# Patient Record
Sex: Male | Born: 1940 | Race: White | Hispanic: No | Marital: Married | Smoking: Never smoker
Health system: Southern US, Community
[De-identification: ages and names within clinical notes are randomized; demographics above are authoritative.]

## PROBLEM LIST (undated history)

## (undated) DIAGNOSIS — N289 Disorder of kidney and ureter, unspecified: Secondary | ICD-10-CM

---

## 2014-05-04 DIAGNOSIS — M5440 Lumbago with sciatica, unspecified side: Secondary | ICD-10-CM | POA: Diagnosis not present

## 2014-05-04 DIAGNOSIS — Z6828 Body mass index (BMI) 28.0-28.9, adult: Secondary | ICD-10-CM | POA: Diagnosis not present

## 2014-08-15 DIAGNOSIS — M5440 Lumbago with sciatica, unspecified side: Secondary | ICD-10-CM | POA: Diagnosis not present

## 2014-08-15 DIAGNOSIS — Z6828 Body mass index (BMI) 28.0-28.9, adult: Secondary | ICD-10-CM | POA: Diagnosis not present

## 2014-09-14 DIAGNOSIS — Z6828 Body mass index (BMI) 28.0-28.9, adult: Secondary | ICD-10-CM | POA: Diagnosis not present

## 2014-09-14 DIAGNOSIS — L723 Sebaceous cyst: Secondary | ICD-10-CM | POA: Diagnosis not present

## 2014-09-14 DIAGNOSIS — Z9181 History of falling: Secondary | ICD-10-CM | POA: Diagnosis not present

## 2015-05-31 DIAGNOSIS — Z205 Contact with and (suspected) exposure to viral hepatitis: Secondary | ICD-10-CM | POA: Diagnosis not present

## 2015-05-31 DIAGNOSIS — Z6828 Body mass index (BMI) 28.0-28.9, adult: Secondary | ICD-10-CM | POA: Diagnosis not present

## 2015-05-31 DIAGNOSIS — M5440 Lumbago with sciatica, unspecified side: Secondary | ICD-10-CM | POA: Diagnosis not present

## 2015-06-16 DIAGNOSIS — M5136 Other intervertebral disc degeneration, lumbar region: Secondary | ICD-10-CM | POA: Diagnosis not present

## 2015-06-26 DIAGNOSIS — M47816 Spondylosis without myelopathy or radiculopathy, lumbar region: Secondary | ICD-10-CM | POA: Diagnosis not present

## 2015-06-26 DIAGNOSIS — M5136 Other intervertebral disc degeneration, lumbar region: Secondary | ICD-10-CM | POA: Diagnosis not present

## 2015-06-28 DIAGNOSIS — M5136 Other intervertebral disc degeneration, lumbar region: Secondary | ICD-10-CM | POA: Diagnosis not present

## 2015-07-14 DIAGNOSIS — M5416 Radiculopathy, lumbar region: Secondary | ICD-10-CM | POA: Diagnosis not present

## 2015-07-14 DIAGNOSIS — M5136 Other intervertebral disc degeneration, lumbar region: Secondary | ICD-10-CM | POA: Diagnosis not present

## 2015-08-22 DIAGNOSIS — M65311 Trigger thumb, right thumb: Secondary | ICD-10-CM | POA: Diagnosis not present

## 2015-08-28 DIAGNOSIS — M65311 Trigger thumb, right thumb: Secondary | ICD-10-CM | POA: Diagnosis not present

## 2015-12-01 DIAGNOSIS — M5136 Other intervertebral disc degeneration, lumbar region: Secondary | ICD-10-CM | POA: Diagnosis not present

## 2015-12-06 DIAGNOSIS — J00 Acute nasopharyngitis [common cold]: Secondary | ICD-10-CM | POA: Diagnosis not present

## 2015-12-06 DIAGNOSIS — Z23 Encounter for immunization: Secondary | ICD-10-CM | POA: Diagnosis not present

## 2015-12-15 DIAGNOSIS — M5136 Other intervertebral disc degeneration, lumbar region: Secondary | ICD-10-CM | POA: Diagnosis not present

## 2015-12-15 DIAGNOSIS — M549 Dorsalgia, unspecified: Secondary | ICD-10-CM | POA: Diagnosis not present

## 2016-04-19 DIAGNOSIS — H35372 Puckering of macula, left eye: Secondary | ICD-10-CM | POA: Diagnosis not present

## 2016-04-19 DIAGNOSIS — H35353 Cystoid macular degeneration, bilateral: Secondary | ICD-10-CM | POA: Diagnosis not present

## 2016-04-19 DIAGNOSIS — H43812 Vitreous degeneration, left eye: Secondary | ICD-10-CM | POA: Diagnosis not present

## 2016-04-24 DIAGNOSIS — E663 Overweight: Secondary | ICD-10-CM | POA: Diagnosis not present

## 2016-04-24 DIAGNOSIS — Z6829 Body mass index (BMI) 29.0-29.9, adult: Secondary | ICD-10-CM | POA: Diagnosis not present

## 2016-04-24 DIAGNOSIS — M545 Low back pain: Secondary | ICD-10-CM | POA: Diagnosis not present

## 2016-04-24 DIAGNOSIS — Z1389 Encounter for screening for other disorder: Secondary | ICD-10-CM | POA: Diagnosis not present

## 2016-05-21 DIAGNOSIS — M5136 Other intervertebral disc degeneration, lumbar region: Secondary | ICD-10-CM | POA: Diagnosis not present

## 2016-05-21 DIAGNOSIS — M5410 Radiculopathy, site unspecified: Secondary | ICD-10-CM | POA: Diagnosis not present

## 2016-05-31 DIAGNOSIS — M5416 Radiculopathy, lumbar region: Secondary | ICD-10-CM | POA: Diagnosis not present

## 2016-05-31 DIAGNOSIS — M5136 Other intervertebral disc degeneration, lumbar region: Secondary | ICD-10-CM | POA: Diagnosis not present

## 2016-06-24 DIAGNOSIS — R109 Unspecified abdominal pain: Secondary | ICD-10-CM | POA: Diagnosis not present

## 2016-06-24 DIAGNOSIS — J09X2 Influenza due to identified novel influenza A virus with other respiratory manifestations: Secondary | ICD-10-CM | POA: Diagnosis not present

## 2016-06-24 DIAGNOSIS — R072 Precordial pain: Secondary | ICD-10-CM | POA: Diagnosis not present

## 2016-06-24 DIAGNOSIS — R05 Cough: Secondary | ICD-10-CM | POA: Diagnosis not present

## 2016-06-24 DIAGNOSIS — J189 Pneumonia, unspecified organism: Secondary | ICD-10-CM | POA: Diagnosis not present

## 2016-06-24 DIAGNOSIS — R0789 Other chest pain: Secondary | ICD-10-CM | POA: Diagnosis not present

## 2016-06-24 DIAGNOSIS — R079 Chest pain, unspecified: Secondary | ICD-10-CM | POA: Diagnosis not present

## 2016-06-24 DIAGNOSIS — R110 Nausea: Secondary | ICD-10-CM | POA: Diagnosis not present

## 2016-06-24 DIAGNOSIS — R0602 Shortness of breath: Secondary | ICD-10-CM | POA: Diagnosis not present

## 2016-06-24 DIAGNOSIS — R51 Headache: Secondary | ICD-10-CM | POA: Diagnosis not present

## 2016-06-24 DIAGNOSIS — I672 Cerebral atherosclerosis: Secondary | ICD-10-CM | POA: Diagnosis not present

## 2016-06-24 DIAGNOSIS — R1084 Generalized abdominal pain: Secondary | ICD-10-CM | POA: Diagnosis not present

## 2016-06-25 DIAGNOSIS — J189 Pneumonia, unspecified organism: Secondary | ICD-10-CM | POA: Diagnosis not present

## 2016-06-25 DIAGNOSIS — R110 Nausea: Secondary | ICD-10-CM | POA: Diagnosis not present

## 2016-06-25 DIAGNOSIS — R072 Precordial pain: Secondary | ICD-10-CM | POA: Diagnosis not present

## 2016-06-25 DIAGNOSIS — J101 Influenza due to other identified influenza virus with other respiratory manifestations: Secondary | ICD-10-CM | POA: Diagnosis not present

## 2016-06-26 DIAGNOSIS — J101 Influenza due to other identified influenza virus with other respiratory manifestations: Secondary | ICD-10-CM | POA: Diagnosis not present

## 2016-06-26 DIAGNOSIS — R110 Nausea: Secondary | ICD-10-CM | POA: Diagnosis not present

## 2016-06-26 DIAGNOSIS — R072 Precordial pain: Secondary | ICD-10-CM | POA: Diagnosis not present

## 2016-06-26 DIAGNOSIS — J189 Pneumonia, unspecified organism: Secondary | ICD-10-CM | POA: Diagnosis not present

## 2016-06-26 DIAGNOSIS — R1110 Vomiting, unspecified: Secondary | ICD-10-CM | POA: Diagnosis not present

## 2016-06-27 DIAGNOSIS — R1110 Vomiting, unspecified: Secondary | ICD-10-CM | POA: Diagnosis not present

## 2016-06-27 DIAGNOSIS — R110 Nausea: Secondary | ICD-10-CM | POA: Diagnosis not present

## 2016-06-27 DIAGNOSIS — J189 Pneumonia, unspecified organism: Secondary | ICD-10-CM | POA: Diagnosis not present

## 2016-06-27 DIAGNOSIS — J101 Influenza due to other identified influenza virus with other respiratory manifestations: Secondary | ICD-10-CM | POA: Diagnosis not present

## 2016-06-27 DIAGNOSIS — R072 Precordial pain: Secondary | ICD-10-CM | POA: Diagnosis not present

## 2016-07-02 DIAGNOSIS — J189 Pneumonia, unspecified organism: Secondary | ICD-10-CM | POA: Diagnosis not present

## 2016-07-02 DIAGNOSIS — Z09 Encounter for follow-up examination after completed treatment for conditions other than malignant neoplasm: Secondary | ICD-10-CM | POA: Diagnosis not present

## 2016-07-05 DIAGNOSIS — R079 Chest pain, unspecified: Secondary | ICD-10-CM | POA: Diagnosis not present

## 2016-07-10 DIAGNOSIS — R072 Precordial pain: Secondary | ICD-10-CM | POA: Diagnosis not present

## 2016-07-10 DIAGNOSIS — I459 Conduction disorder, unspecified: Secondary | ICD-10-CM | POA: Diagnosis not present

## 2016-09-09 DIAGNOSIS — J449 Chronic obstructive pulmonary disease, unspecified: Secondary | ICD-10-CM | POA: Diagnosis not present

## 2016-10-01 DIAGNOSIS — R938 Abnormal findings on diagnostic imaging of other specified body structures: Secondary | ICD-10-CM | POA: Diagnosis not present

## 2016-10-01 DIAGNOSIS — Z8659 Personal history of other mental and behavioral disorders: Secondary | ICD-10-CM | POA: Diagnosis not present

## 2016-10-01 DIAGNOSIS — E663 Overweight: Secondary | ICD-10-CM | POA: Diagnosis not present

## 2016-10-01 DIAGNOSIS — M79675 Pain in left toe(s): Secondary | ICD-10-CM | POA: Diagnosis not present

## 2016-10-01 DIAGNOSIS — N200 Calculus of kidney: Secondary | ICD-10-CM | POA: Diagnosis not present

## 2016-10-01 DIAGNOSIS — Z6827 Body mass index (BMI) 27.0-27.9, adult: Secondary | ICD-10-CM | POA: Diagnosis not present

## 2016-10-01 DIAGNOSIS — Z139 Encounter for screening, unspecified: Secondary | ICD-10-CM | POA: Diagnosis not present

## 2016-10-01 DIAGNOSIS — I2510 Atherosclerotic heart disease of native coronary artery without angina pectoris: Secondary | ICD-10-CM | POA: Diagnosis not present

## 2016-10-02 DIAGNOSIS — S99922A Unspecified injury of left foot, initial encounter: Secondary | ICD-10-CM | POA: Diagnosis not present

## 2016-10-02 DIAGNOSIS — M79675 Pain in left toe(s): Secondary | ICD-10-CM | POA: Diagnosis not present

## 2016-12-20 DIAGNOSIS — E663 Overweight: Secondary | ICD-10-CM | POA: Diagnosis not present

## 2016-12-20 DIAGNOSIS — R6883 Chills (without fever): Secondary | ICD-10-CM | POA: Diagnosis not present

## 2016-12-20 DIAGNOSIS — Z23 Encounter for immunization: Secondary | ICD-10-CM | POA: Diagnosis not present

## 2016-12-20 DIAGNOSIS — R350 Frequency of micturition: Secondary | ICD-10-CM | POA: Diagnosis not present

## 2016-12-20 DIAGNOSIS — R3989 Other symptoms and signs involving the genitourinary system: Secondary | ICD-10-CM | POA: Diagnosis not present

## 2016-12-20 DIAGNOSIS — J4521 Mild intermittent asthma with (acute) exacerbation: Secondary | ICD-10-CM | POA: Diagnosis not present

## 2016-12-20 DIAGNOSIS — R509 Fever, unspecified: Secondary | ICD-10-CM | POA: Diagnosis not present

## 2016-12-21 ENCOUNTER — Encounter (HOSPITAL_BASED_OUTPATIENT_CLINIC_OR_DEPARTMENT_OTHER): Payer: Self-pay | Admitting: Emergency Medicine

## 2016-12-21 ENCOUNTER — Emergency Department (HOSPITAL_BASED_OUTPATIENT_CLINIC_OR_DEPARTMENT_OTHER)
Admission: EM | Admit: 2016-12-21 | Discharge: 2016-12-21 | Disposition: A | Discharge status: Home / Self Care | Payer: Medicare HMO | Attending: Emergency Medicine | Admitting: Emergency Medicine

## 2016-12-21 ENCOUNTER — Emergency Department (HOSPITAL_BASED_OUTPATIENT_CLINIC_OR_DEPARTMENT_OTHER): Payer: Medicare HMO

## 2016-12-21 DIAGNOSIS — R109 Unspecified abdominal pain: Secondary | ICD-10-CM | POA: Insufficient documentation

## 2016-12-21 DIAGNOSIS — R69 Illness, unspecified: Secondary | ICD-10-CM | POA: Diagnosis not present

## 2016-12-21 DIAGNOSIS — R531 Weakness: Secondary | ICD-10-CM | POA: Diagnosis not present

## 2016-12-21 DIAGNOSIS — R110 Nausea: Secondary | ICD-10-CM | POA: Insufficient documentation

## 2016-12-21 DIAGNOSIS — R350 Frequency of micturition: Secondary | ICD-10-CM | POA: Insufficient documentation

## 2016-12-21 DIAGNOSIS — J111 Influenza due to unidentified influenza virus with other respiratory manifestations: Secondary | ICD-10-CM

## 2016-12-21 DIAGNOSIS — R509 Fever, unspecified: Secondary | ICD-10-CM | POA: Diagnosis not present

## 2016-12-21 HISTORY — DX: Disorder of kidney and ureter, unspecified: N28.9

## 2016-12-21 LAB — CBC WITH DIFFERENTIAL/PLATELET
BASOS ABS: 0 10*3/uL (ref 0.0–0.1)
BASOS PCT: 0 %
EOS ABS: 0 10*3/uL (ref 0.0–0.7)
Eosinophils Relative: 0 %
HEMATOCRIT: 46.1 % (ref 39.0–52.0)
Hemoglobin: 15.7 g/dL (ref 13.0–17.0)
LYMPHS ABS: 0.5 10*3/uL — AB (ref 0.7–4.0)
LYMPHS PCT: 11 %
MCH: 31.5 pg (ref 26.0–34.0)
MCHC: 34.1 g/dL (ref 30.0–36.0)
MCV: 92.6 fL (ref 78.0–100.0)
Monocytes Absolute: 0.5 10*3/uL (ref 0.1–1.0)
Monocytes Relative: 10 %
NEUTROS ABS: 3.9 10*3/uL (ref 1.7–7.7)
NEUTROS PCT: 79 %
PLATELETS: 131 10*3/uL — AB (ref 150–400)
RBC: 4.98 MIL/uL (ref 4.22–5.81)
RDW: 13.4 % (ref 11.5–15.5)
WBC: 4.9 10*3/uL (ref 4.0–10.5)

## 2016-12-21 LAB — COMPREHENSIVE METABOLIC PANEL
ALBUMIN: 3.7 g/dL (ref 3.5–5.0)
ALK PHOS: 67 U/L (ref 38–126)
ALT: 20 U/L (ref 17–63)
ANION GAP: 8 (ref 5–15)
AST: 24 U/L (ref 15–41)
BUN: 15 mg/dL (ref 6–20)
CALCIUM: 8.3 mg/dL — AB (ref 8.9–10.3)
CO2: 22 mmol/L (ref 22–32)
Chloride: 105 mmol/L (ref 101–111)
Creatinine, Ser: 1.03 mg/dL (ref 0.61–1.24)
GFR calc Af Amer: >60 mL/min (ref >60)
GFR calc non Af Amer: >60 mL/min (ref >60)
GLUCOSE: 98 mg/dL (ref 65–99)
POTASSIUM: 4 mmol/L (ref 3.5–5.1)
SODIUM: 135 mmol/L (ref 135–145)
Total Bilirubin: 0.6 mg/dL (ref 0.3–1.2)
Total Protein: 6.5 g/dL (ref 6.5–8.1)

## 2016-12-21 LAB — URINALYSIS, ROUTINE W REFLEX MICROSCOPIC
GLUCOSE, UA: NEGATIVE mg/dL
Hgb urine dipstick: NEGATIVE
KETONES UR: 15 mg/dL — AB
LEUKOCYTES UA: NEGATIVE
NITRITE: NEGATIVE
PH: 5.5 (ref 5.0–8.0)
PROTEIN: NEGATIVE mg/dL
Specific Gravity, Urine: >1.03 — ABNORMAL HIGH (ref 1.005–1.030)

## 2016-12-21 LAB — LIPASE, BLOOD: Lipase: 28 U/L (ref 11–51)

## 2016-12-21 MED ORDER — SODIUM CHLORIDE 0.9 % IV BOLUS (SEPSIS)
1000.0000 mL | Freq: Once | INTRAVENOUS | Status: AC
  Administered 2016-12-21: 1000 mL via INTRAVENOUS

## 2016-12-21 MED ORDER — OSELTAMIVIR PHOSPHATE 75 MG PO CAPS: 75.0000 mg | ORAL_CAPSULE | Freq: Two times a day (BID) | ORAL | 0 refills | Status: AC

## 2016-12-21 MED ORDER — KETOROLAC TROMETHAMINE 30 MG/ML IJ SOLN
10.0000 mg | Freq: Once | INTRAMUSCULAR | Status: AC
  Administered 2016-12-21: 9.9 mg via INTRAVENOUS
  Filled 2016-12-21: qty 1

## 2016-12-21 MED ORDER — IOPAMIDOL (ISOVUE-300) INJECTION 61%
100.0000 mL | Freq: Once | INTRAVENOUS | Status: AC | PRN
  Administered 2016-12-21: 100 mL via INTRAVENOUS

## 2016-12-21 NOTE — ED Triage Notes (Signed)
Fever, chills, L calf pain, body aches x 3 days. Neg [facility] (yesterday and today). Pt was also seen by PCP.

## 2016-12-21 NOTE — ED Notes (Addendum)
Pt not in room, pt in xray/CT.  

## 2016-12-21 NOTE — ED Provider Notes (Signed)
[facility]   CSN: [id number] Arrival date & time: 12/21/16  1731     History   Chief Complaint Chief Complaint  Patient presents with  . Fever    HPI Alex Clay is a 76 y.o. male.  HPI Well-appearing male presents with flulike illness.  Patient states it is well until 3 days ago. Evening he developed generalized discomfort, mild anorexia, nausea, but no focal pain. No cough Subsequent relief found himself to be febrile, and over the interval has had ongoing chills, fever, as well as generalized discomfort. No focal dysuria, though he does have polyuria. No confusion, no disorientation, no neck pain, no rash, no vision changes, no sore throat.  Past Medical History:  Diagnosis Date  . Renal disorder     There are no active problems to display for this patient.   History reviewed. No pertinent surgical history.     Home Medications    Prior to Admission medications   Not on File    Family History No family history on file.  Social History Social History  Substance Use Topics  . Smoking status: Never Smoker  . Smokeless tobacco: Never Used  . Alcohol use Not on file     Allergies   Patient has no known allergies.   Review of Systems Review of Systems  Constitutional:       Per HPI, otherwise negative  HENT:       Per HPI, otherwise negative  Respiratory:       Per HPI, otherwise negative  Cardiovascular:       Per HPI, otherwise negative  Gastrointestinal: Negative for vomiting.  Endocrine:       Negative aside from HPI  Genitourinary:       Neg aside from HPI   Musculoskeletal:       Per HPI, otherwise negative  Skin: Negative.   Neurological: Positive for weakness. Negative for syncope.     Physical Exam Updated Vital Signs BP 118/69 (BP Location: Left Arm)   Pulse 69   Temp 99.1 F (37.3 C) (Oral)   Resp 16   Ht  (1.753 m)   Wt 83 kg (183 lb)   SpO2 95%   BMI 27.02 kg/m   Physical  Exam  Constitutional: He is oriented to person, place, and time. He appears well-developed. No distress.  HENT:  Head: Normocephalic and atraumatic.  Eyes: Conjunctivae and EOM are normal.  Cardiovascular: Normal rate and regular rhythm.   Pulmonary/Chest: Effort normal. No stridor. No respiratory distress.  Abdominal: He exhibits no distension.  Musculoskeletal: He exhibits no edema.  Neurological: He is alert and oriented to person, place, and time.  Skin: Skin is warm and dry.  Psychiatric: He has a normal mood and affect.  Nursing note and vitals reviewed.    ED Treatments / Results  Labs (all labs ordered are listed, but only abnormal results are displayed) Labs Reviewed  COMPREHENSIVE METABOLIC PANEL - Abnormal; Notable for the following:       Result Value   Calcium 8.3 (*)    All other components within normal limits  CBC WITH DIFFERENTIAL/PLATELET - Abnormal; Notable for the following:    Platelets 131 (*)    Lymphs Abs 0.5 (*)    All other components within normal limits  URINALYSIS, ROUTINE W REFLEX MICROSCOPIC - Abnormal; Notable for the following:    Specific Gravity, Urine >1.030 (*)    Bilirubin Urine SMALL (*)  Ketones, ur 15 (*)    All other components within normal limits  LIPASE, BLOOD    Radiology Dg Chest 2 View  Result Date: 12/21/2016 CLINICAL DATA:  Fevers. EXAM: CHEST  2 VIEW COMPARISON:  09/09/2016 FINDINGS: The heart size and mediastinal contours are within normal limits. Both lungs are clear. The visualized skeletal structures are unremarkable. IMPRESSION: No active cardiopulmonary disease. Electronically Signed   By: Signa Kell M.D.   On: 12/21/2016 19:31   Ct Abdomen Pelvis W Contrast  Result Date: 12/21/2016 CLINICAL DATA:  Left upper quadrant abdominal pain. Fever. Anorexia. Remote history of perinephric abscess. EXAM: CT ABDOMEN AND PELVIS WITH CONTRAST TECHNIQUE: Multidetector CT imaging of the abdomen and pelvis was performed using  the standard protocol following bolus administration of intravenous contrast. CONTRAST:  ISOVUE-300 IOPAMIDOL (ISOVUE-300) INJECTION 61% COMPARISON:  CT 06/24/2016 FINDINGS: Lower chest: Minimal hypoventilatory atelectasis. No pleural fluid or consolidation. Hepatobiliary: No focal hepatic lesion. Gallbladder physiologically distended, no calcified stone. No biliary dilatation. Pancreas: No ductal dilatation or inflammation. Spleen: Normal in size without focal abnormality. Splenule inferiorly at the hilum. Adrenals/Urinary Tract: Normal adrenal glands. No hydronephrosis or perinephric edema. Homogeneous renal enhancement with symmetric excretion on delayed phase imaging. Parapelvic cysts in the right kidney, with mild right upper lobe scarring. 13 mm exophytic cyst in the mid left kidney. No findings to suggest renal infection. Urinary bladder is partially distended without wall thickening. Stomach/Bowel: Small hiatal hernia. Nondistended stomach, possible gastric wall thickening versus nondistention. No small bowel obstruction, wall thickening or inflammatory change. Appendix identified with moderate certainty and normal. Liquid stool in the ascending colon. Remainder the colon is decompressed. Distal descending and sigmoid diverticulosis without diverticulitis. Vascular/Lymphatic: Moderate calcified and noncalcified atheromatous plaque. No aneurysm. No enlarged abdominal or pelvic lymph nodes. Reproductive: Enlarged prostate gland spanning 6.3 cm requesting mass reflect on the bladder base. Other: Tiny fat containing umbilical hernia. No free air, free fluid, or intra-abdominal fluid collection. Musculoskeletal: Scattered degenerative change in the spine, most prominent at L5-S1. There are no acute or suspicious osseous abnormalities. IMPRESSION: 1. Possible gastric wall thickening. This may be gastritis or nondistention. No other explanation for abdominal pain. 2. Colonic diverticulosis without  diverticulitis. 3. Chronic incidental findings include hiatal hernia, enlarged prostate gland, and Aortic Atherosclerosis (ICD10-I70.0). Electronically Signed   By: Rubye Oaks M.D.   On: 12/21/2016 21:10    Procedures Procedures (including critical care time)  Medications Ordered in ED Medications  sodium chloride 0.9 % bolus 1,000 mL (1,000 mLs Intravenous New Bag/Given 12/21/16 1826)  ketorolac (TORADOL) 30 MG/ML injection 9.9 mg (9.9 mg Intravenous Given 12/21/16 1832)  iopamidol (ISOVUE-300) 61 % injection 100 mL (100 mLs Intravenous Contrast Given 12/21/16 2035)     Initial Impression / Assessment and Plan / ED Course  I have reviewed the triage vital signs and the nursing notes.  Pertinent labs & imaging results that were available during my care of the patient were reviewed by me and considered in my medical decision making (see chart for details).  Clinical Course as of Dec 22 2154  Sat Dec 21, 2016  2021 After initial resuscitation patient confused and discomfort, fever, has some left-sided pain.With history of perinephric abscess, CT scan ordered.  [RL]    Clinical Course User Index [RL] Gerhard Munch, MD   9:56 PM Patient comfortable appearing, in no distress per We discussed all findings with reassuring x-ray, CT scan, labs, urinalysis. Patient is afebrile, hemodynamically stable per No evidence for bacteremia,  sepsis, pneumonia, acute abdominal processes. No confusion, no evidence for meningitis.  Patient has influenza-like illness, which we discussed at length. Patient will follow-up with primary care.   Final Clinical Impressions(s) / ED Diagnoses  Influenza-like illness   Gerhard Munch, MD 12/21/16 2157

## 2016-12-21 NOTE — Discharge Instructions (Signed)
As discussed, your evaluation today has been largely reassuring.  But, it is important that you monitor your condition carefully, and do not hesitate to return to the ED if you develop new, or concerning changes in your condition. ? ?Otherwise, please follow-up with your physician for appropriate ongoing care. ? ?

## 2017-05-15 DIAGNOSIS — Z1212 Encounter for screening for malignant neoplasm of rectum: Secondary | ICD-10-CM | POA: Diagnosis not present

## 2017-05-15 DIAGNOSIS — Z79899 Other long term (current) drug therapy: Secondary | ICD-10-CM | POA: Diagnosis not present

## 2017-05-15 DIAGNOSIS — E785 Hyperlipidemia, unspecified: Secondary | ICD-10-CM | POA: Diagnosis not present

## 2017-05-15 DIAGNOSIS — E663 Overweight: Secondary | ICD-10-CM | POA: Diagnosis not present

## 2017-05-15 DIAGNOSIS — Z6827 Body mass index (BMI) 27.0-27.9, adult: Secondary | ICD-10-CM | POA: Diagnosis not present

## 2017-05-15 DIAGNOSIS — Z0000 Encounter for general adult medical examination without abnormal findings: Secondary | ICD-10-CM | POA: Diagnosis not present

## 2017-05-15 DIAGNOSIS — Z9181 History of falling: Secondary | ICD-10-CM | POA: Diagnosis not present

## 2017-05-15 DIAGNOSIS — Z800 Family history of malignant neoplasm of digestive organs: Secondary | ICD-10-CM | POA: Diagnosis not present

## 2017-05-15 DIAGNOSIS — M5440 Lumbago with sciatica, unspecified side: Secondary | ICD-10-CM | POA: Diagnosis not present

## 2017-05-15 DIAGNOSIS — Z1331 Encounter for screening for depression: Secondary | ICD-10-CM | POA: Diagnosis not present

## 2017-05-15 DIAGNOSIS — Z23 Encounter for immunization: Secondary | ICD-10-CM | POA: Diagnosis not present

## 2017-09-22 DIAGNOSIS — I2510 Atherosclerotic heart disease of native coronary artery without angina pectoris: Secondary | ICD-10-CM | POA: Diagnosis not present

## 2017-09-22 DIAGNOSIS — M5440 Lumbago with sciatica, unspecified side: Secondary | ICD-10-CM | POA: Diagnosis not present

## 2017-09-22 DIAGNOSIS — N400 Enlarged prostate without lower urinary tract symptoms: Secondary | ICD-10-CM | POA: Diagnosis not present

## 2017-09-22 DIAGNOSIS — E663 Overweight: Secondary | ICD-10-CM | POA: Diagnosis not present

## 2017-09-22 DIAGNOSIS — Z6829 Body mass index (BMI) 29.0-29.9, adult: Secondary | ICD-10-CM | POA: Diagnosis not present

## 2017-09-22 DIAGNOSIS — E785 Hyperlipidemia, unspecified: Secondary | ICD-10-CM | POA: Diagnosis not present

## 2017-12-03 DIAGNOSIS — M25511 Pain in right shoulder: Secondary | ICD-10-CM | POA: Diagnosis not present

## 2017-12-05 DIAGNOSIS — M25511 Pain in right shoulder: Secondary | ICD-10-CM | POA: Diagnosis not present

## 2017-12-05 DIAGNOSIS — M19011 Primary osteoarthritis, right shoulder: Secondary | ICD-10-CM | POA: Diagnosis not present

## 2017-12-12 DIAGNOSIS — M25511 Pain in right shoulder: Secondary | ICD-10-CM | POA: Diagnosis not present

## 2017-12-26 DIAGNOSIS — Z23 Encounter for immunization: Secondary | ICD-10-CM | POA: Diagnosis not present

## 2018-01-17 IMAGING — CT CT ABD-PELV W/ CM
2 of 5 series · 16 of 46 positions shown, 18 images · IV contrast (APPLIED)
Comparison: CT 06/24/2016

CLINICAL DATA: Left upper quadrant abdominal pain. Fever. Anorexia.
Remote history of perinephric abscess.

EXAM:
CT ABDOMEN AND PELVIS WITH CONTRAST
TECHNIQUE: Multidetector CT imaging of the abdomen and pelvis was performed
using the standard protocol following bolus administration of
intravenous contrast.
CONTRAST:  100mL [id number] IOPAMIDOL ([id number]) INJECTION 61%

[Series 2: axial st · axial · 0.91mm/px · z∈[-508,-78]mm · 13 of 98 slices shown, 15 images]
[im 6/98  soft-tissue]
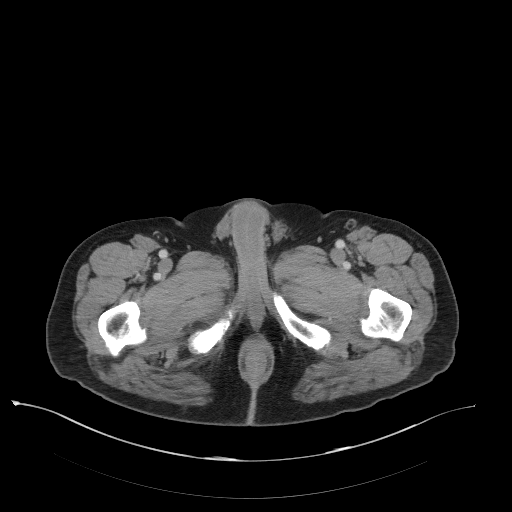
[im 6/98  bone]
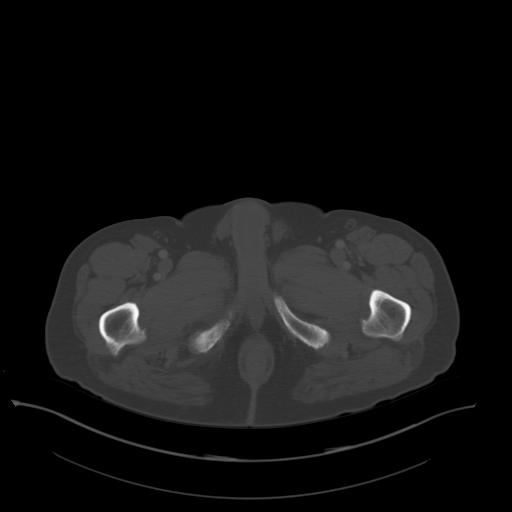
[im 16/98  soft-tissue]
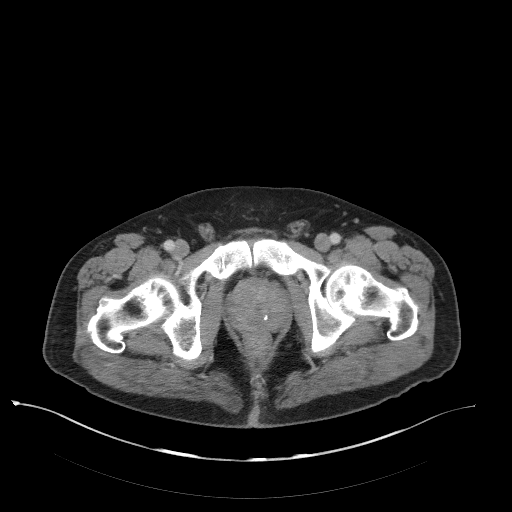
[im 21/98  soft-tissue]
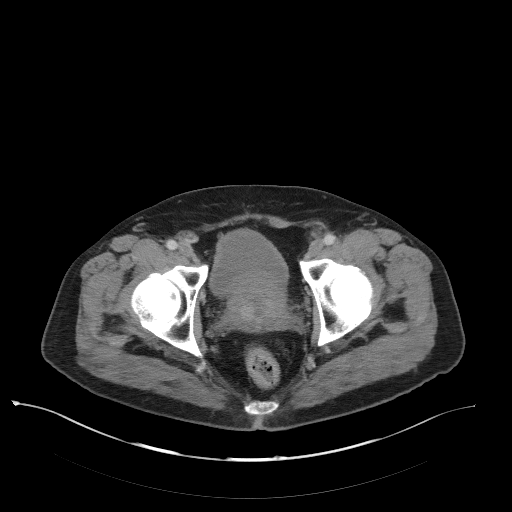
[im 26/98  soft-tissue]
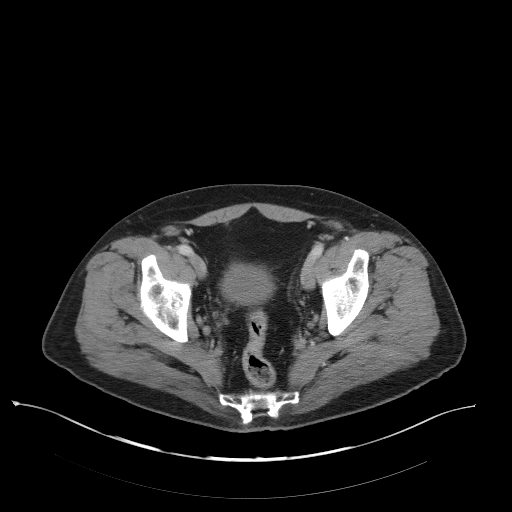
[im 36/98  soft-tissue]
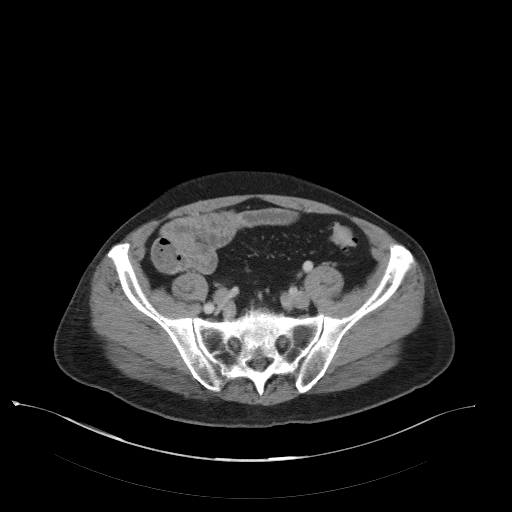
[im 41/98  soft-tissue]
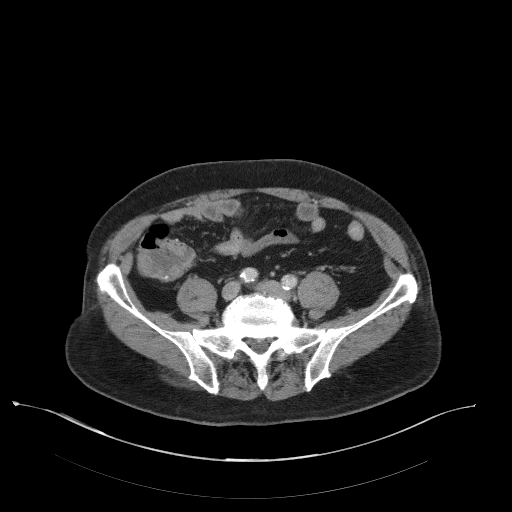
[im 52/98  soft-tissue]
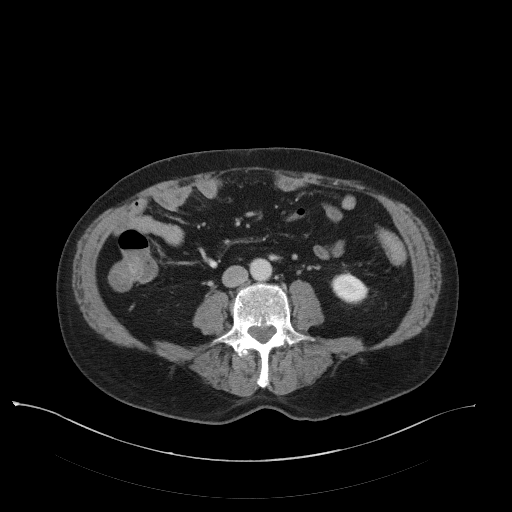
[im 57/98  soft-tissue]
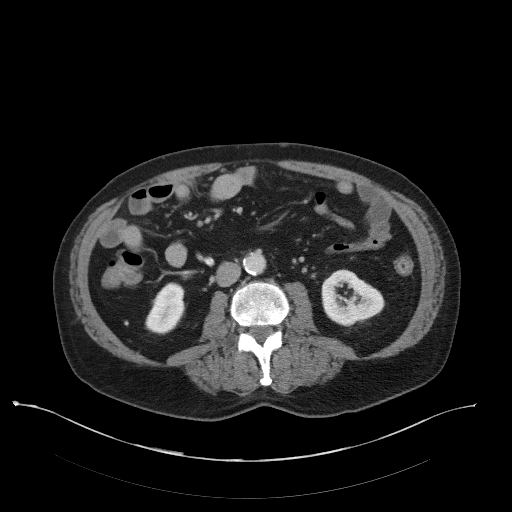
[im 62/98  soft-tissue]
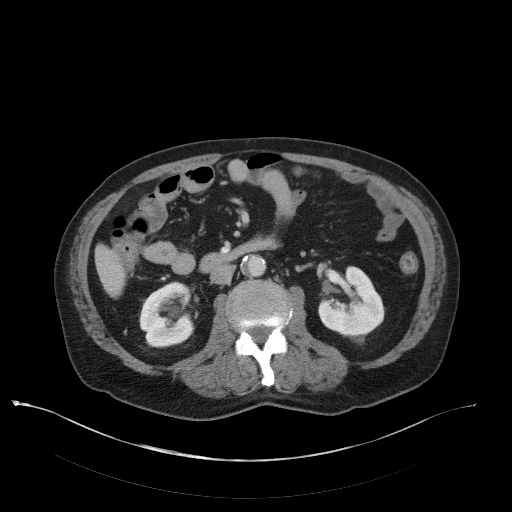
[im 62/98  bone]
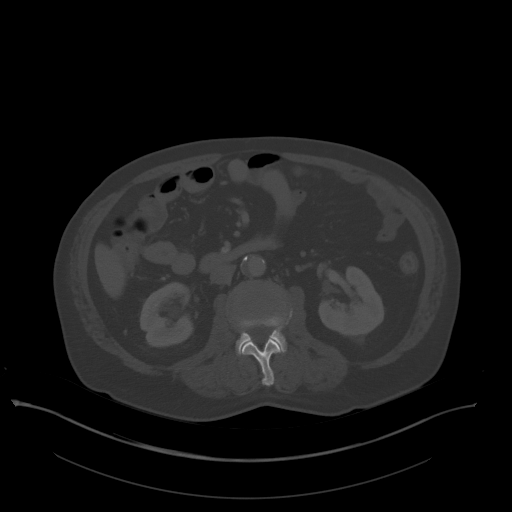
[im 72/98  soft-tissue]
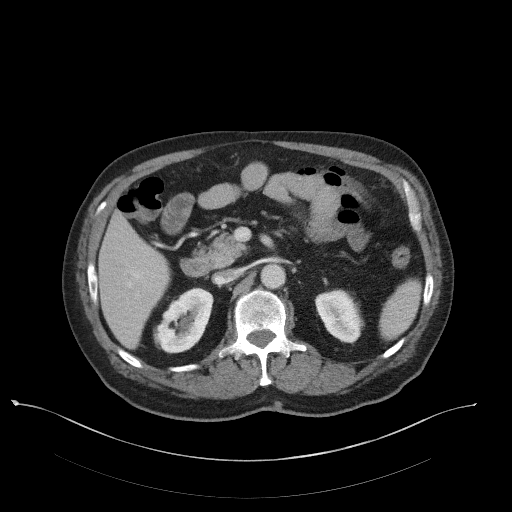
[im 77/98  soft-tissue]
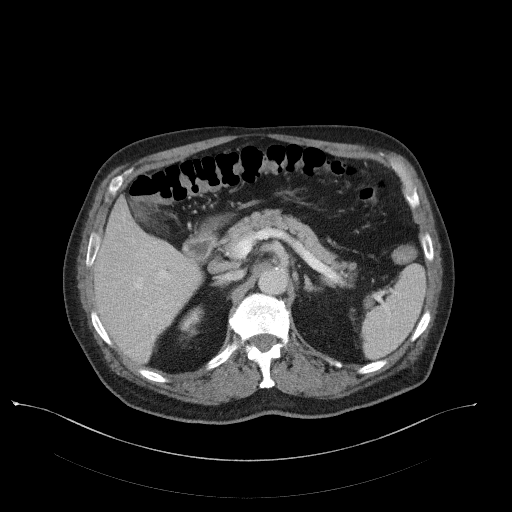
[im 82/98  soft-tissue]
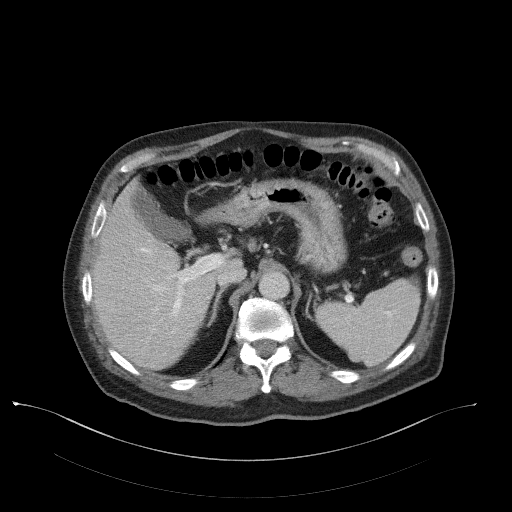
[im 92/98  soft-tissue]
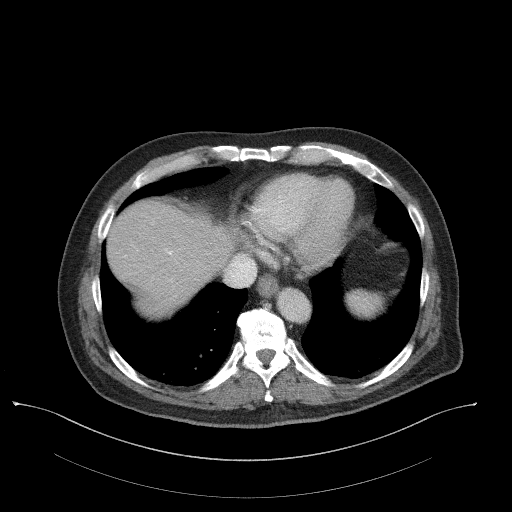

[Series 5: coronal st · coronal · 0.79mm/px · 3 of 94 slices shown]
[im 32/94  soft-tissue]
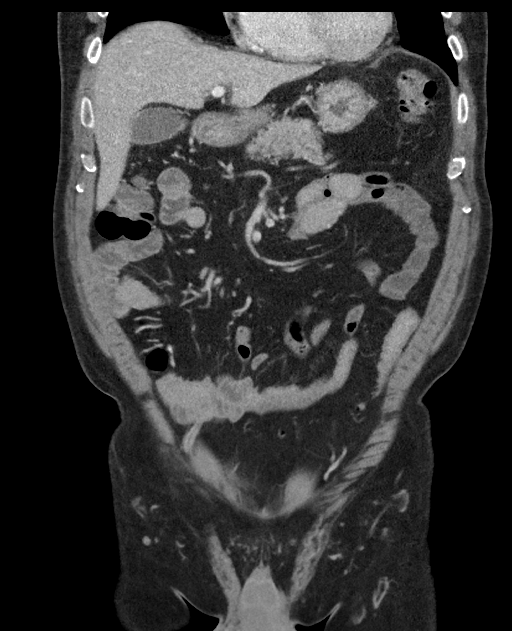
[im 42/94  soft-tissue]
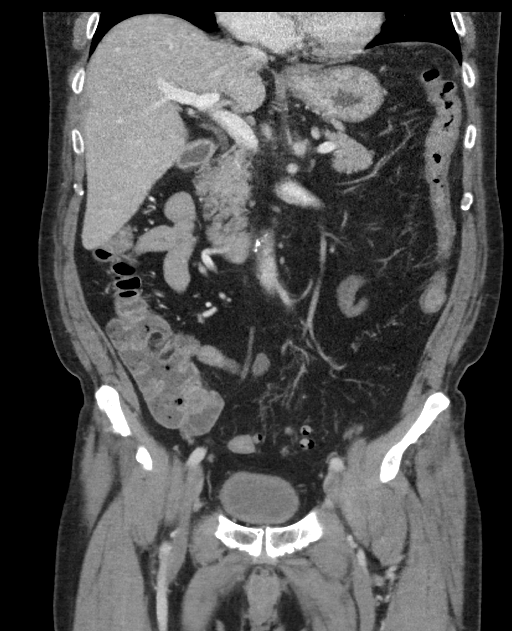
[im 52/94  soft-tissue]
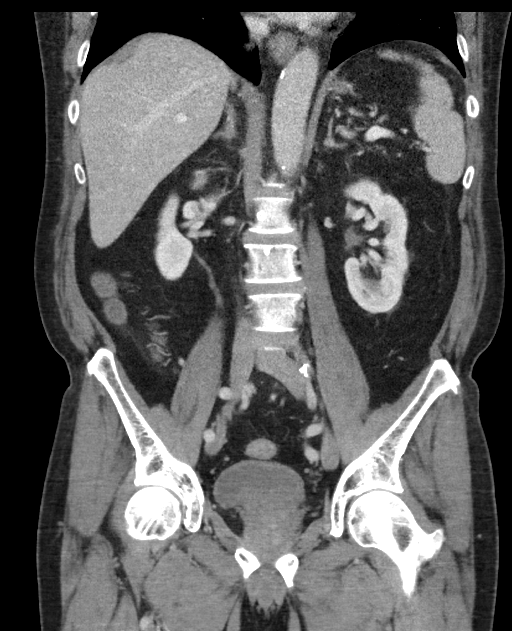

[16 of 46 positions shown; findings below may reference images not displayed]

FINDINGS: Lower chest: Minimal hypoventilatory atelectasis. No pleural fluid
or consolidation.

Hepatobiliary: No focal hepatic lesion. Gallbladder physiologically
distended, no calcified stone. No biliary dilatation.

Pancreas: No ductal dilatation or inflammation.

Spleen: Normal in size without focal abnormality. Splenule
inferiorly at the hilum.

Adrenals/Urinary Tract: Normal adrenal glands. No hydronephrosis or
perinephric edema. Homogeneous renal enhancement with symmetric
excretion on delayed phase imaging. Parapelvic cysts in the right
kidney, with mild right upper lobe scarring. 13 mm exophytic cyst in
the mid left kidney. No findings to suggest renal infection. Urinary
bladder is partially distended without wall thickening.

Stomach/Bowel: Small hiatal hernia. Nondistended stomach, possible
gastric wall thickening versus nondistention. No small bowel
obstruction, wall thickening or inflammatory change. Appendix
identified with moderate certainty and normal. Liquid stool in the
ascending colon. Remainder the colon is decompressed. Distal
descending and sigmoid diverticulosis without diverticulitis.

Vascular/Lymphatic: Moderate calcified and noncalcified atheromatous
plaque. No aneurysm. No enlarged abdominal or pelvic lymph nodes.

Reproductive: Enlarged prostate gland spanning 6.3 cm requesting
mass reflect on the bladder base.

Other: Tiny fat containing umbilical hernia. No free air, free
fluid, or intra-abdominal fluid collection.

Musculoskeletal: Scattered degenerative change in the spine, most
prominent at L5-S1. There are no acute or suspicious osseous
abnormalities.
IMPRESSION: 1. Possible gastric wall thickening. This may be gastritis or
nondistention. No other explanation for abdominal pain.
2. Colonic diverticulosis without diverticulitis.
3. Chronic incidental findings include hiatal hernia, enlarged
prostate gland, and Aortic Atherosclerosis ([id number]).

## 2018-05-06 DIAGNOSIS — M62838 Other muscle spasm: Secondary | ICD-10-CM | POA: Diagnosis not present

## 2018-05-06 DIAGNOSIS — M542 Cervicalgia: Secondary | ICD-10-CM | POA: Diagnosis not present

## 2018-05-20 DIAGNOSIS — H35372 Puckering of macula, left eye: Secondary | ICD-10-CM | POA: Diagnosis not present

## 2018-05-20 DIAGNOSIS — H26493 Other secondary cataract, bilateral: Secondary | ICD-10-CM | POA: Diagnosis not present

## 2018-05-20 DIAGNOSIS — H43812 Vitreous degeneration, left eye: Secondary | ICD-10-CM | POA: Diagnosis not present

## 2018-05-20 DIAGNOSIS — H02422 Myogenic ptosis of left eyelid: Secondary | ICD-10-CM | POA: Diagnosis not present

## 2018-05-20 DIAGNOSIS — H532 Diplopia: Secondary | ICD-10-CM | POA: Diagnosis not present

## 2018-08-18 DIAGNOSIS — H26493 Other secondary cataract, bilateral: Secondary | ICD-10-CM | POA: Diagnosis not present

## 2018-08-18 DIAGNOSIS — H35373 Puckering of macula, bilateral: Secondary | ICD-10-CM | POA: Diagnosis not present

## 2018-08-18 DIAGNOSIS — H26492 Other secondary cataract, left eye: Secondary | ICD-10-CM | POA: Diagnosis not present

## 2018-08-18 DIAGNOSIS — Z961 Presence of intraocular lens: Secondary | ICD-10-CM | POA: Diagnosis not present

## 2018-08-18 DIAGNOSIS — H18413 Arcus senilis, bilateral: Secondary | ICD-10-CM | POA: Diagnosis not present

## 2018-09-17 DIAGNOSIS — H43812 Vitreous degeneration, left eye: Secondary | ICD-10-CM | POA: Diagnosis not present

## 2018-09-17 DIAGNOSIS — H35372 Puckering of macula, left eye: Secondary | ICD-10-CM | POA: Diagnosis not present
